# Patient Record
Sex: Female | Born: 1995 | Race: White | Hispanic: No | Marital: Single
Health system: Southern US, Community
[De-identification: ages and names within clinical notes are randomized; demographics above are authoritative.]

---
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
## 2017-10-23 IMAGING — CT CT ABD-PELV W/ CM
2 of 4 series · 15 of 46 positions shown, 17 images · IV contrast (APPLIED)
Comparison: None available.

CLINICAL DATA: Initial evaluation for acute upper abdominal pain
for 3 days. No vomiting or diarrhea.

EXAM:
CT ABDOMEN AND PELVIS WITH CONTRAST
TECHNIQUE: Multidetector CT imaging of the abdomen and pelvis was performed
using the standard protocol following bolus administration of
intravenous contrast.
CONTRAST:  70 cc of Isovue 370.

[Series 2: routine abd/pel with · axial · 0.62mm/px · z∈[-377,-27]mm · 12 of 84 slices shown, 14 images]
[im 7/84  soft-tissue]
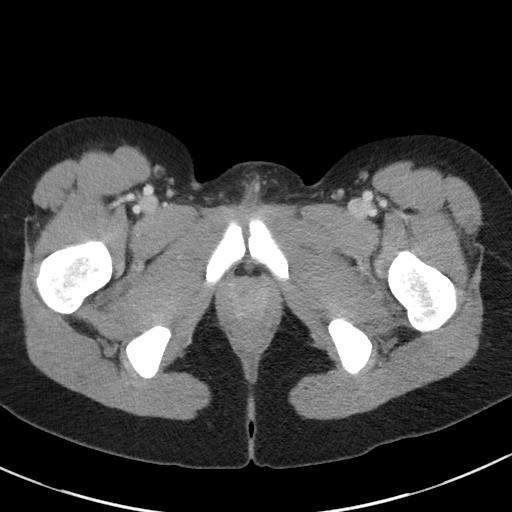
[im 7/84  bone]
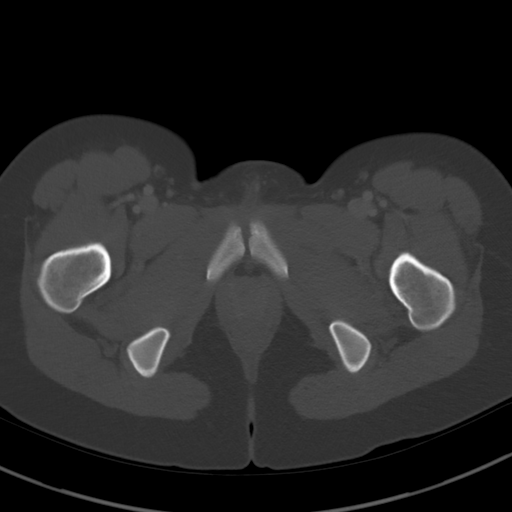
[im 13/84  soft-tissue]
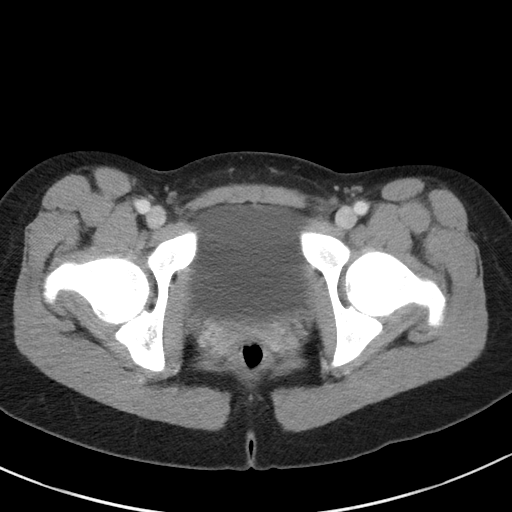
[im 20/84  soft-tissue]
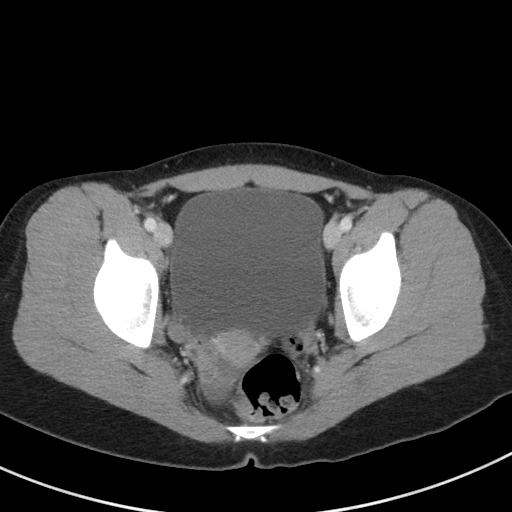
[im 26/84  soft-tissue]
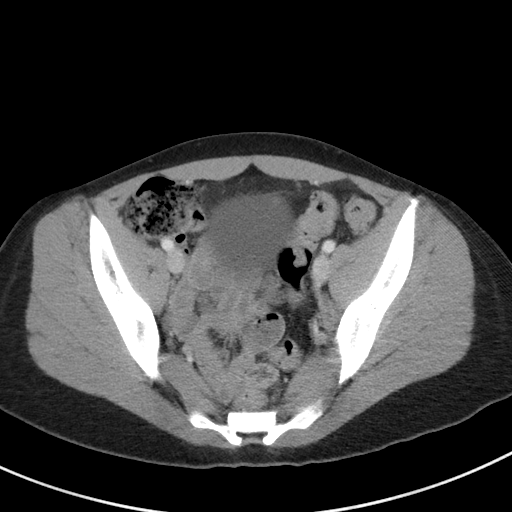
[im 32/84  soft-tissue]
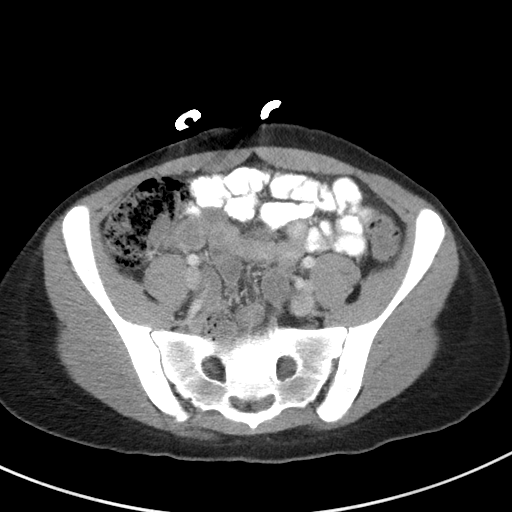
[im 39/84  soft-tissue]
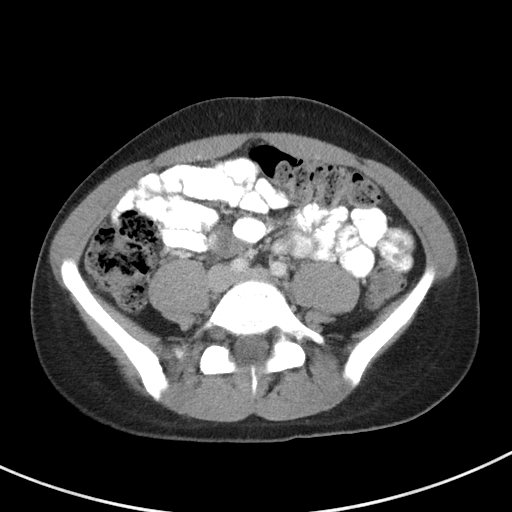
[im 45/84  soft-tissue]
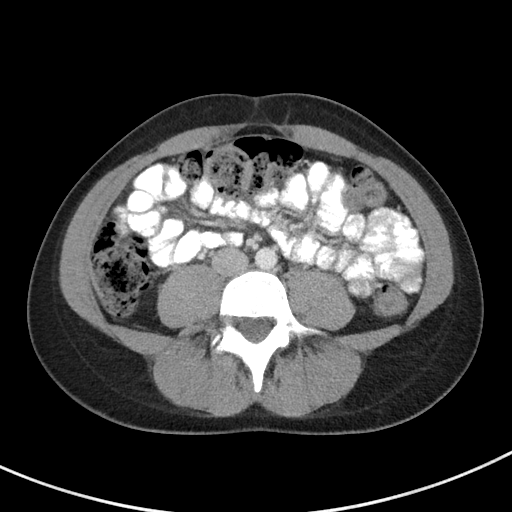
[im 52/84  soft-tissue]
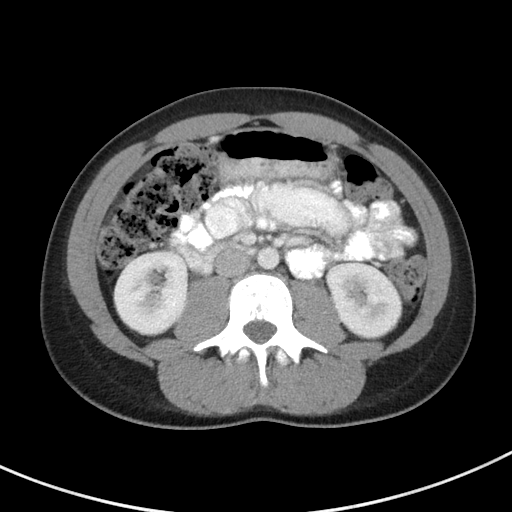
[im 58/84  soft-tissue]
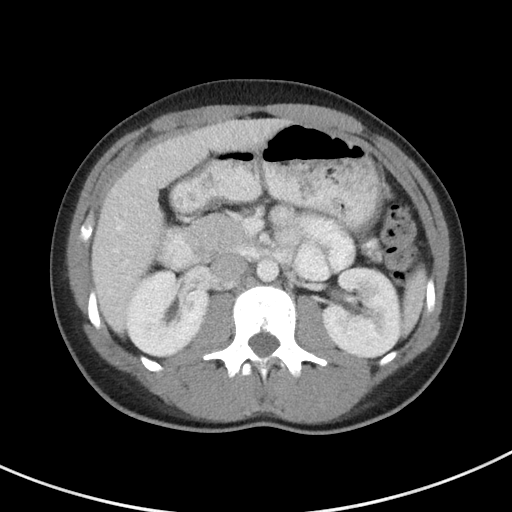
[im 58/84  bone]
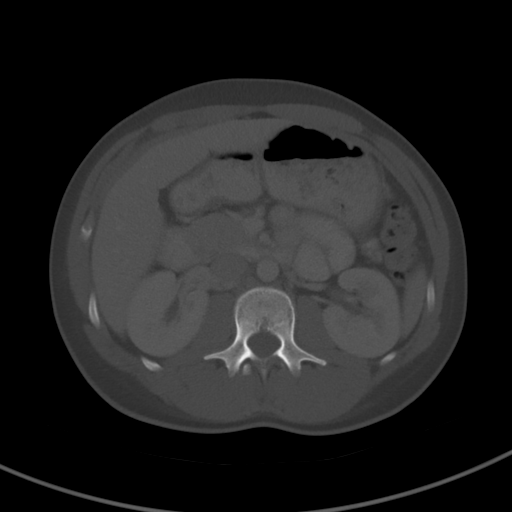
[im 64/84  soft-tissue]
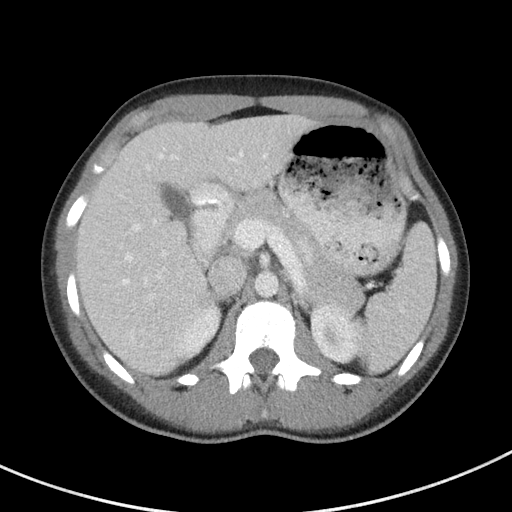
[im 71/84  soft-tissue]
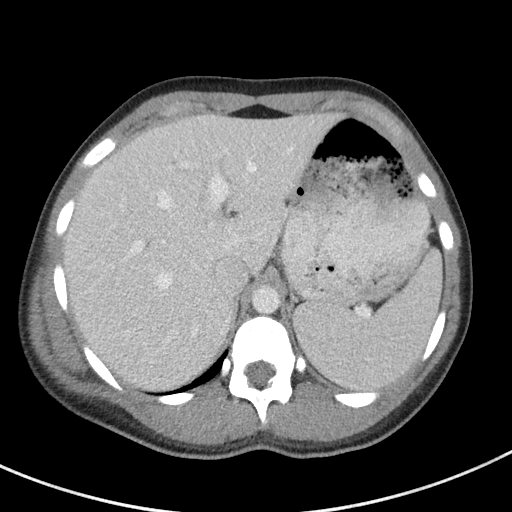
[im 77/84  soft-tissue]
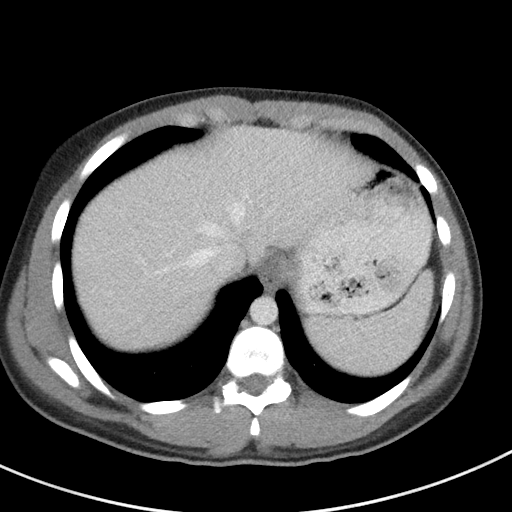

[Series 5: coronal st · coronal · 0.58mm/px · 3 of 73 slices shown]
[im 25/73  soft-tissue]
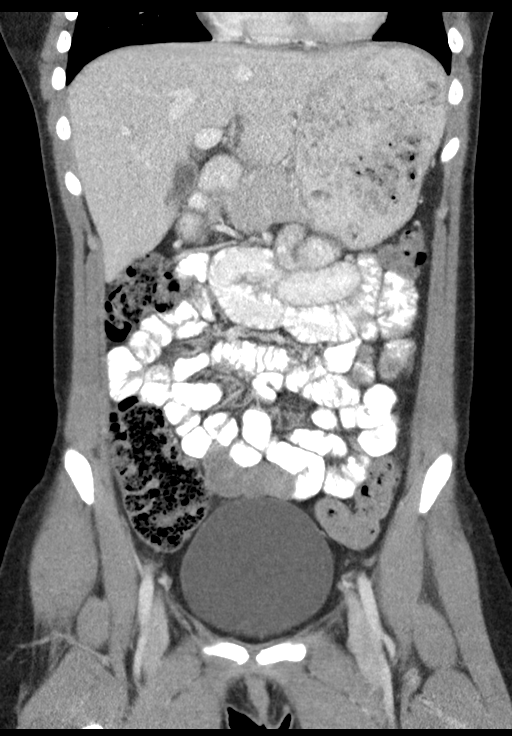
[im 33/73  soft-tissue]
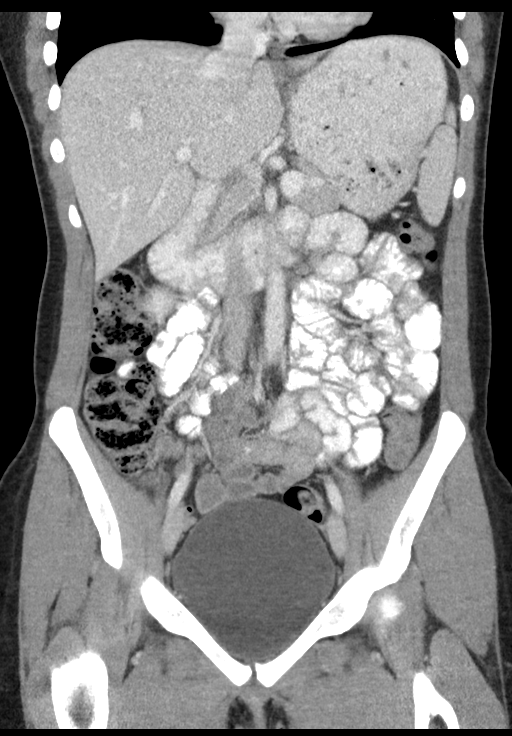
[im 41/73  soft-tissue]
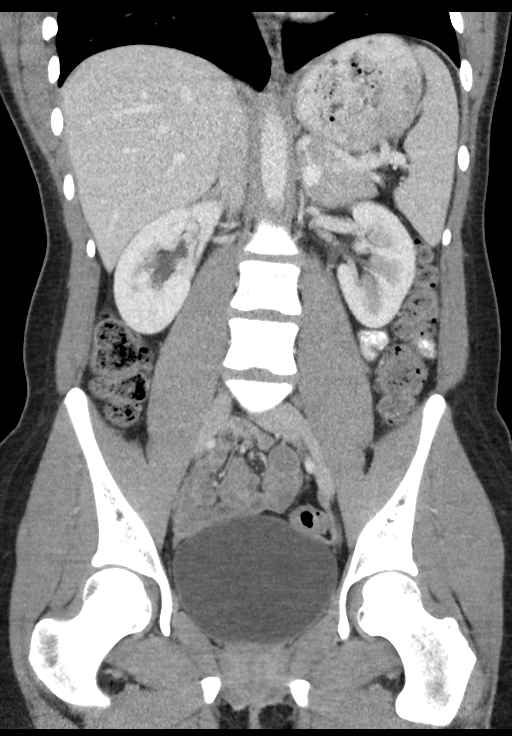

[15 of 46 positions shown; findings below may reference images not displayed]

FINDINGS: Lower chest: Minimal hazy subsegmental atelectasis seen dependently
within the visualized lung bases. Visualized lungs are otherwise
clear.

Hepatobiliary: Liver demonstrates a normal contrast enhanced
appearance. Gallbladder normal. No biliary dilatation.

Pancreas: Pancreas within normal limits.

Spleen: Spleen within normal limits.

Adrenals/Urinary Tract: Adrenal gland are normal. Kidneys equal in
size with symmetric enhancement. No nephrolithiasis, hydronephrosis,
or focal enhancing renal mass. No hydroureter. Bladder well
distended and within normal limits.

Stomach/Bowel: Stomach within normal limits. Small bowel of normal
caliber and appearance without evidence for obstruction or acute
inflammatory changes. Appendix visualized within the right lower
quadrant and is within normal limits without findings to suggest
acute appendicitis. Moderate to large amount of retained stool
within the colon, suggesting constipation. No acute inflammatory
changes about the colon.

Vascular/Lymphatic: Normal intravascular enhancement seen throughout
the intra-abdominal aorta and its branch vessels. No pathologically
enlarged intra-abdominal or pelvic lymph nodes identified.

Reproductive: Uterus and ovaries within normal limits for age.

Other: Small volume free fluid within the pelvis, presumably
physiologic. No free intraperitoneal air.

Musculoskeletal: No acute osseus abnormality. No worrisome lytic or
blastic osseous lesions.
IMPRESSION: 1. No CT evidence for acute intra-abdominopelvic process.
2. Normal appendix.
3. Moderate to large amount of retained stool within the colon,
suggesting constipation.
4. Small volume free fluid within the pelvis, presumably
physiologic.
